# Patient Record
Sex: Female | Born: 1990 | Race: White | Hispanic: No | Marital: Married | Smoking: Never smoker
Health system: Southern US, Community
[De-identification: ages and names within clinical notes are randomized; demographics above are authoritative.]

## PROBLEM LIST (undated history)

## (undated) DIAGNOSIS — J45909 Unspecified asthma, uncomplicated: Secondary | ICD-10-CM

## (undated) HISTORY — PX: APPENDECTOMY: SHX54

## (undated) HISTORY — PX: TONSILLECTOMY: SUR1361

---

## 2009-07-21 ENCOUNTER — Encounter: Payer: Self-pay | Admitting: Family Medicine

## 2009-07-27 ENCOUNTER — Ambulatory Visit: Payer: Self-pay | Admitting: Sports Medicine

## 2009-07-27 DIAGNOSIS — M21969 Unspecified acquired deformity of unspecified lower leg: Secondary | ICD-10-CM | POA: Insufficient documentation

## 2009-07-27 DIAGNOSIS — M549 Dorsalgia, unspecified: Secondary | ICD-10-CM | POA: Insufficient documentation

## 2012-12-09 ENCOUNTER — Encounter: Payer: Self-pay | Admitting: *Deleted

## 2012-12-09 ENCOUNTER — Emergency Department
Admission: EM | Admit: 2012-12-09 | Discharge: 2012-12-09 | Disposition: A | Discharge status: Home / Self Care | Payer: BC Managed Care – PPO | Source: Home / Self Care

## 2012-12-09 DIAGNOSIS — R55 Syncope and collapse: Secondary | ICD-10-CM

## 2012-12-09 HISTORY — DX: Unspecified asthma, uncomplicated: J45.909

## 2012-12-09 LAB — HEMOGLOBIN A1C
Hgb A1c MFr Bld: 5.5 % (ref <5.7)
Mean Plasma Glucose: 111 mg/dL (ref <117)

## 2012-12-09 LAB — POCT CBC W AUTO DIFF (K'VILLE URGENT CARE)

## 2012-12-09 NOTE — ED Notes (Signed)
Gail Sullivan c/o hx of dizziness, tremors and loss of consciousness x 1 episode 1 month ago. Since she has had a couple episodes of the same symptoms without loss of consciousness. Today she developed the dizziness and tremors and came right over to be evaluated. She last ate this AM. Denies HA, blurry vision. C/o mild nausea.

## 2012-12-10 LAB — COMPLETE METABOLIC PANEL WITH GFR
ALT: 20 U/L (ref 0–35)
AST: 24 U/L (ref 0–37)
Albumin: 4.6 g/dL (ref 3.5–5.2)
Alkaline Phosphatase: 53 U/L (ref 39–117)
Potassium: 4.4 mEq/L (ref 3.5–5.3)
Sodium: 138 mEq/L (ref 135–145)
Total Bilirubin: 0.4 mg/dL (ref 0.3–1.2)
Total Protein: 7.5 g/dL (ref 6.0–8.3)

## 2012-12-10 LAB — TSH: TSH: 2.751 u[IU]/mL (ref 0.350–4.500)

## 2012-12-10 NOTE — ED Provider Notes (Signed)
History     CSN: [id number]  Arrival date & time 12/09/12  1301   First MD Initiated Contact with Patient 12/09/12 1407      Chief Complaint  Patient presents with  . Dizziness    HPI  Pt presents today with chief complaint of dizziness/pre syncope.  Pt states that she has had intermittent episodes over the last 1-2 months.  1st episode occurred when she was in the shower her husband, they were having sex per pt.  Pt also also had episodes (1-2) after this that last seconds to minutes. Intermittent in nature, but usually happened after prolonged episodes of standing per pt  Pt reports having mild generalized, weakness, dizziness with episodes.  Pt unsure of LOC, though reports being aroused/awoken by husband with 1st episode. No LOC after 1st episode.  Pt states that she felt a mild episode coming on at work today and wanted to be evaluated. Pt states that she feels extremely anxious when she feels these episodes come on and feels that she cannot control symptoms.  Pt does report being under high amounts of stress.  No cardiac history.  Pt reports an episode of hypoglycemia in the past that was similar to these episodes.  No CP with episodes.  Mild SOB and palpitations.  Baseline hx/o asthma. Has had no wheezing. No albuterol use.  Had IUD put in 2-3 weeks ago. Does not believe that she is pregnant.    Past Medical History  Diagnosis Date  . Asthma     Past Surgical History  Procedure Laterality Date  . Tonsillectomy    . Appendectomy      Family History  Problem Relation Age of Onset  . Thyroid disease Mother   . Hypertension Mother   . Hypertension Father   . Cancer Father     throat    History  Substance Use Topics  . Smoking status: Never Smoker   . Smokeless tobacco: Never Used  . Alcohol Use: Yes    OB History   Grav Para Term Preterm Abortions TAB SAB Ect Mult Living                  Review of Systems  All other systems reviewed and are  negative.    Allergies  Amoxicillin; Bee venom; and Shellfish allergy  Home Medications   Current Outpatient Rx  Name  Route  Sig  Dispense  Refill  . albuterol (PROVENTIL) (2.5 MG/3ML) 0.083% nebulizer solution   Nebulization   Take 2.5 mg by nebulization every 6 (six) hours as needed for wheezing.         Marland Kitchen levonorgestrel (MIRENA) 20 MCG/24HR IUD   Intrauterine   1 each by Intrauterine route once.           BP 128/79  Pulse 85  Ht 5\' 5"  (1.651 m)  Wt 152 lb (68.947 kg)  BMI 25.29 kg/m2  SpO2 100%  Physical Exam  Constitutional: She is oriented to person, place, and time. She appears well-developed and well-nourished.  HENT:  Head: Normocephalic and atraumatic.  Right Ear: External ear normal.  Left Ear: External ear normal.  Mouth/Throat: Oropharynx is clear and moist.  Eyes: Conjunctivae are normal. Pupils are equal, round, and reactive to light.  Neck: Normal range of motion. Neck supple.  Cardiovascular: Normal rate, regular rhythm, normal heart sounds and intact distal pulses.   No murmur heard. Pulmonary/Chest: Effort normal. She has no wheezes. She has no rales.  Abdominal: Soft.  Bowel sounds are normal. There is no tenderness.  Neurological: She is alert and oriented to person, place, and time. No cranial nerve deficit. Coordination normal.  Skin: Skin is warm.  Psychiatric: She has a normal mood and affect.    ED Course  Procedures (including critical care time) EKG: NSR Labs Reviewed  TSH   Narrative:    Performed at:  [facility]                [address]                [place], [place] [address]  HEMOGLOBIN A1C   Narrative:    Performed at:  [facility]                [address]                [place], [place] [address]  COMPLETE METABOLIC PANEL WITH GFR   Narrative:    Performed at:  [facility]                [address]                [place], [place] [address]  POCT  CBC W AUTO DIFF ([facility])  POCT FASTING CBG K[facility] MANUAL ENTRY   No results found.   1. Syncope   2. Pre-syncope   3. Vasovagal near syncope       MDM  EKG, CBC, CBG all WNL today.  Orthostatics normal. Asymptomatic.  Upreg negative.  Ddx includes vasovagal episodes, anxiety Discussed neuroimaging (head CT). Pt declined.  Will also check baseline labs including TSH, A1C, and CMET.  Discussed primary care follow up for further evaluation as well as neurocardio red flags.  Follow up as needed.       The patient and/or caregiver has been counseled thoroughly with regard to treatment plan and/or medications prescribed including dosage, schedule, interactions, rationale for use, and possible side effects and they verbalize understanding. Diagnoses and expected course of recovery discussed and will return if not improved as expected or if the condition worsens. Patient and/or caregiver verbalized understanding.               Doree Albee, MD 12/10/12 1154

## 2012-12-14 ENCOUNTER — Telehealth: Payer: Self-pay | Admitting: Emergency Medicine

## 2012-12-15 ENCOUNTER — Telehealth: Payer: Self-pay | Admitting: Emergency Medicine

## 2013-03-11 ENCOUNTER — Ambulatory Visit (INDEPENDENT_AMBULATORY_CARE_PROVIDER_SITE_OTHER): Payer: BC Managed Care – PPO | Admitting: Physician Assistant

## 2013-03-11 ENCOUNTER — Encounter: Payer: Self-pay | Admitting: Physician Assistant

## 2013-03-11 VITALS — BP 127/85 | HR 88 | Temp 98.3°F | Ht 64.5 in | Wt 159.0 lb

## 2013-03-11 DIAGNOSIS — M797 Fibromyalgia: Secondary | ICD-10-CM

## 2013-03-11 DIAGNOSIS — K1370 Unspecified lesions of oral mucosa: Secondary | ICD-10-CM

## 2013-03-11 DIAGNOSIS — J029 Acute pharyngitis, unspecified: Secondary | ICD-10-CM

## 2013-03-11 DIAGNOSIS — J45990 Exercise induced bronchospasm: Secondary | ICD-10-CM

## 2013-03-11 DIAGNOSIS — K1379 Other lesions of oral mucosa: Secondary | ICD-10-CM

## 2013-03-11 DIAGNOSIS — IMO0001 Reserved for inherently not codable concepts without codable children: Secondary | ICD-10-CM

## 2013-03-11 DIAGNOSIS — J019 Acute sinusitis, unspecified: Secondary | ICD-10-CM

## 2013-03-11 DIAGNOSIS — K121 Other forms of stomatitis: Secondary | ICD-10-CM

## 2013-03-11 MED ORDER — AZITHROMYCIN 250 MG PO TABS: ORAL_TABLET | ORAL | Status: DC

## 2013-03-11 MED ORDER — FLUTICASONE PROPIONATE 50 MCG/ACT NA SUSP: 2.0000 | Freq: Every day | NASAL | Status: DC

## 2013-03-11 NOTE — Patient Instructions (Addendum)
Oral Ulcers °Oral ulcers are painful, shallow sores around the lining of the mouth. They can affect the gums, the inside of the lips and the cheeks (sores on the outside of the lips and on the face are different). They typically first occur in school aged children and teenagers. Oral ulcers may also be called canker sores or cold sores. °CAUSES  °Canker sores and cold sores can be caused by many factors including: °· Infection. °· Injury. °· Sun exposure. °· Medications. °· Emotional stress. °· Food allergies. °· Vitamin deficiencies. °· Toothpastes containing sodium lauryl sulfate. °The Herpes Virus can be the cause of mouth ulcers. The first infection can be severe and cause 10 or more ulcers on the gums, tongue and lips with fever and difficulty in swallowing. This infection usually occurs between the ages of 1 and 3 years.  °SYMPTOMS  °The typical sore is about ¼ inch (6 mm) in size, is an oval or round ulcer with red borders. °DIAGNOSIS  °Your caregiver can diagnose simple oral ulcers by examination. Additional testing is usually not required.  °TREATMENT  °Treatment is aimed at pain relief. Generally, oral ulcers resolve by themselves within 1 to 2 weeks without medication and are not contagious unless caused by Herpes (and other viruses). Antibiotics are not effective with mouth sores. Avoid direct contact with others until the ulcer is completely healed. See your caregiver for follow-up care as recommended. Also: °· Offer a soft diet. °· Encourage plenty of fluids to prevent dehydration. Popsicles and milk shakes can be helpful. °· Avoid acidic and salty foods and drinks such as orange juice. °· Infants and young children will often refuse to drink because of pain. Using a teaspoon, cup or syringe to give small amounts of fluids frequently can help prevent dehydration. °· Cold compresses on the face may help reduce pain. °· Pain medication can help control soreness. °· A solution of diphenhydramine mixed  with a liquid antacid can be useful to decrease the soreness of ulcers. Consult a caregiver for the dosing. °· Liquids or ointments with a numbing ingredient may be helpful when used as recommended. °· Older children and teenagers can rinse their mouth with a salt-water mixture (1/2 teaspoonof salt in 8 ounces of water) four times a day. This treatment is uncomfortable but may reduce the time the ulcers are present. °· There are many over the counter throat lozenges and medications available for oral ulcers. There effectiveness has not been studied. °· Consult your medical caregiver prior to using homeopathic treatments for oral ulcers. °SEEK MEDICAL CARE IF:  °· You think your child needs to be seen. °· The pain worsens and you cannot control it. °· There are 4 or more ulcers. °· The lips and gums begin to bleed and crust. °· A single mouth ulcer is near a tooth that is causing a toothache or pain. °· Your child has a fever, swollen face, or swollen glands. °· The ulcers began after starting a medication. °· Mouth ulcers keep re-occurring or last more than 2 weeks. °· You think your child is not taking adequate fluids. °SEEK IMMEDIATE MEDICAL CARE IF:  °· Your child has a high fever. °· Your child is unable to swallow or becomes dehydrated. °· Your child looks or acts very ill. °· An ulcer caused by a chemical your child accidentally put in their mouth. °Document Released: 10/11/2004 Document Revised: 11/26/2011 Document Reviewed: 05/26/2009 °[employer]® Patient Information ©2014 [employer]. ° °

## 2013-03-11 NOTE — Progress Notes (Signed)
  Subjective:    Patient ID: Gail Sullivan, female    DOB: 1990/10/03, 22 y.o.   MRN: [id number]  HPI Patient is a 22 yo female who presents to the clinic to establish care. She has had sore throat, sinus pressure, left ear pain, headaches for a little over a week. They have continue to get worse despite OTC decongestants and mucinex. She has had a dry cough and feels like her chest has started to get tight. Denies any fever, chills, n/v/d. She does have hx of exercise induced asthma but rarely needs albuterol inhaler.   She has history of recurrent canker sores and wants to know why. Mouthwash dentist gave her does help.   PMH of fibromyalgia per patient she has been put on numerous medications but diet and exercise have worked the best. She occasionally takes neurontin for nerve pain and does help.   She denies any smoking and only social drinking.   Family hx is positive for thyroid issues, HTN, and Hyperlipidemia.      Review of Systems  Constitutional: Positive for fatigue.  Cardiovascular: Negative.   Genitourinary: Negative.   Musculoskeletal: Positive for myalgias, back pain and arthralgias.  Skin: Positive for pallor.       Objective:   Physical Exam  Constitutional: She appears well-developed and well-nourished.  HENT:  Head: Normocephalic and atraumatic.  Right Ear: External ear normal.  Left Ear: External ear normal.  TM clear.   Tonsils removed. Erythematous oropharynx no exudate.  Maxillary sinus tenderness to palpation bilaterally.   Eyes: Conjunctivae are normal. Right eye exhibits no discharge. Left eye exhibits no discharge.  Neck: Normal range of motion. Neck supple.  Cardiovascular: Normal rate, regular rhythm and normal heart sounds.   Pulmonary/Chest: Effort normal and breath sounds normal. She has no wheezes.  Lymphadenopathy:    She has no cervical adenopathy.  Skin: Skin is warm and dry.  Psychiatric: She has a normal mood and affect. Her  behavior is normal.          Assessment & Plan:  Sore throat/sinusitis- Rapid strep negative. Symptoms consistent with sinus infection have been going on for over a week. Allergic to Amoxicillin will give zpak. Also gave nasal spray to use daily. Refilled albuterol inhaler to also use as needed for SOB/wheezing. Follow up if not improving.   Oral ulcers- Discussed potential cause. Gave HO on ways to try to prevent and help symptoms. Continue to use mouthwash dentist gave to use as needed.   fibromyagia- I would like to see her records and see what they have tested and recommend. Right now she takes Neurontin as needed. Will discuss at next visit.    Follow up in 1-2 months to talk about ongoing healthy issues and concerns.

## 2013-03-12 DIAGNOSIS — J45990 Exercise induced bronchospasm: Secondary | ICD-10-CM | POA: Insufficient documentation

## 2013-03-12 DIAGNOSIS — M797 Fibromyalgia: Secondary | ICD-10-CM | POA: Insufficient documentation

## 2013-03-12 DIAGNOSIS — K121 Other forms of stomatitis: Secondary | ICD-10-CM | POA: Insufficient documentation

## 2013-03-23 ENCOUNTER — Ambulatory Visit: Payer: BC Managed Care – PPO | Admitting: Physician Assistant

## 2013-05-19 ENCOUNTER — Other Ambulatory Visit: Payer: Self-pay | Admitting: *Deleted

## 2013-05-19 MED ORDER — TRIAMCINOLONE ACETONIDE 0.5 % EX OINT: TOPICAL_OINTMENT | Freq: Two times a day (BID) | CUTANEOUS | Status: DC

## 2013-06-15 ENCOUNTER — Ambulatory Visit (INDEPENDENT_AMBULATORY_CARE_PROVIDER_SITE_OTHER): Payer: 59 | Admitting: Physician Assistant

## 2013-06-15 VITALS — BP 127/78 | HR 74 | Temp 98.3°F | Wt 161.0 lb

## 2013-06-15 DIAGNOSIS — J029 Acute pharyngitis, unspecified: Secondary | ICD-10-CM

## 2013-06-15 DIAGNOSIS — R635 Abnormal weight gain: Secondary | ICD-10-CM

## 2013-06-15 DIAGNOSIS — J45990 Exercise induced bronchospasm: Secondary | ICD-10-CM

## 2013-06-15 DIAGNOSIS — L738 Other specified follicular disorders: Secondary | ICD-10-CM

## 2013-06-15 DIAGNOSIS — J302 Other seasonal allergic rhinitis: Secondary | ICD-10-CM

## 2013-06-15 DIAGNOSIS — R5381 Other malaise: Secondary | ICD-10-CM

## 2013-06-15 DIAGNOSIS — J329 Chronic sinusitis, unspecified: Secondary | ICD-10-CM

## 2013-06-15 DIAGNOSIS — L853 Xerosis cutis: Secondary | ICD-10-CM

## 2013-06-15 DIAGNOSIS — R5383 Other fatigue: Secondary | ICD-10-CM

## 2013-06-15 DIAGNOSIS — J309 Allergic rhinitis, unspecified: Secondary | ICD-10-CM

## 2013-06-15 MED ORDER — AZITHROMYCIN 250 MG PO TABS: ORAL_TABLET | ORAL | Status: DC

## 2013-06-15 NOTE — Progress Notes (Signed)
  Subjective:    Patient ID: Gail Sullivan, female    DOB: 1991/08/24, 22 y.o.   MRN: [id number]  HPI Patient is a 22 yo female who presents to the clinic with 1 week of sinus pressure and sore throat. She thought it was just sinus drainage but has progressively gotten worse. She now is having problems swallowing. She continues to eat. Denies any fever. She has had some cough but not much production. Denies any abdominal pain. She has tried OTC mucinex, vitamin C, cough syrup, ibuprofen. theny have helped some but still has symptoms.   Pt also concerned because the last 6 months she has had lots of skin and hair changes. Her skin has went from oily to dry. She has a lot of hair loss. She continues to gain weight despite exercise and diet changes. She feels like her memory is bad. She would like her Thyroid checked.    Pt also has a hx of exercised induced asthma and allergies she would like to be seen by allergist to do more testing.   Review of Systems     Objective:   Physical Exam  Constitutional: She is oriented to person, place, and time. She appears well-developed and well-nourished.  HENT:  Head: Normocephalic and atraumatic.  Right Ear: External ear normal.  Left Ear: External ear normal.  TM's clear bulging with air bubbles.  Oropharyx erythematous with enlarged tonsils without exudate. Maxillary sinuses tender to palpation bilaterally.  Bilateral turbinates red and swollen.   Eyes: Conjunctivae are normal. Right eye exhibits no discharge. Left eye exhibits no discharge.  Neck: Normal range of motion. Neck supple.  Bilateral anterior cervical lymph nodes enlarged and tender to palpation.   Cardiovascular: Normal rate, regular rhythm and normal heart sounds.   Pulmonary/Chest: Effort normal and breath sounds normal. She has no wheezes.  Lymphadenopathy:    She has cervical adenopathy.  Neurological: She is alert and oriented to person, place, and time.  Skin: Skin is warm  and dry.  Psychiatric: She has a normal mood and affect. Her behavior is normal.          Assessment & Plan:  Sinusitis/acute pharyngitis- Rapid strep negative. Treated with zpak for sinusitis. Discussed symptomatic ideas. Call if not improving or suddenly worsening.  Dry skin/weight gain/hair loss- will check TSH/Free T4 today. Will call with results. If normal discussed coming in for another appt to reevaluate.    Exercise induced asthma/allergies- Will make referral at pt's request.

## 2013-06-15 NOTE — Patient Instructions (Addendum)

## 2013-06-16 LAB — T4, FREE: Free T4: 1.05 ng/dL (ref 0.80–1.80)

## 2013-07-03 ENCOUNTER — Encounter: Payer: Self-pay | Admitting: Physician Assistant

## 2013-07-03 ENCOUNTER — Ambulatory Visit (INDEPENDENT_AMBULATORY_CARE_PROVIDER_SITE_OTHER): Payer: 59 | Admitting: Physician Assistant

## 2013-07-03 ENCOUNTER — Ambulatory Visit (INDEPENDENT_AMBULATORY_CARE_PROVIDER_SITE_OTHER): Payer: 59

## 2013-07-03 VITALS — BP 121/69 | HR 80 | Wt 161.0 lb

## 2013-07-03 DIAGNOSIS — M79609 Pain in unspecified limb: Secondary | ICD-10-CM

## 2013-07-03 DIAGNOSIS — M79671 Pain in right foot: Secondary | ICD-10-CM

## 2013-07-03 MED ORDER — TRAMADOL HCL 50 MG PO TABS: 50.0000 mg | ORAL_TABLET | Freq: Four times a day (QID) | ORAL | Status: DC | PRN

## 2013-07-03 NOTE — Progress Notes (Signed)
  Subjective:    Patient ID: Gail Sullivan, female    DOB: 1991/05/13, 22 y.o.   MRN: [id number]  HPI Patient is a 22 year old female who presents to the clinic with right heel pain for the last month. In the last 3 months she started training for a half marathon. She's been running a lot. She reports she does have good supporting shoes. She however works in shoes that are not as supportive and she's on her feet all day. When she started noticing the pain about a month ago it was just a discomfort. It has continually gotten worse to where when she walks the pain is significant around a 6/10. It is actually better when she is running because of how much support in her shoes. She does notice that she is trying to walk on the lateral aspect of her foot to shield to heal from touching the ground. She has not done anything to help symptoms. There is no pain at rest only pain with palpation or pressure. She has decreased her running back to about a mile or 2 a day.   Review of Systems     Objective:   Physical Exam  Musculoskeletal:  Right foot: Strength 5 out of 5. No pain with palpation over the Achilles tendon. Range of motion of the normal without pain. No pain over the lateral or medial malleolus or metatarsal bones. No pain over the fascia. There is pain on the medial aspect of the right heel. Feels like there is a hard knot almost like a callus. It is not fluctuant or soft. There is some redness and pain with palpation.          Assessment & Plan:  Right heel pain-I will get an x-ray today. X-rays were reviewed and negative for any heel spurs or soft tissue changes. Etiology unclear. Does not appear to be a cyst. Could be a start of plantar wart. Ibuprofen 800mg  up to three times a day. Tramadol as needed. Ice multiple times a day and rest.  Stay in supportive shoes. Follow up if not improving in the next week.

## 2013-07-03 NOTE — Patient Instructions (Addendum)
Ibuprofen 800mg  up to three times a day. Tramadol as needed. Ice multiple times a day and rest.  Will call with xray. Stay in supportive shoes.

## 2013-07-10 ENCOUNTER — Encounter: Payer: Self-pay | Admitting: Physician Assistant

## 2013-07-10 ENCOUNTER — Ambulatory Visit (INDEPENDENT_AMBULATORY_CARE_PROVIDER_SITE_OTHER): Payer: 59 | Admitting: Physician Assistant

## 2013-07-10 VITALS — BP 122/74 | HR 62 | Wt 161.0 lb

## 2013-07-10 DIAGNOSIS — L708 Other acne: Secondary | ICD-10-CM

## 2013-07-10 DIAGNOSIS — L709 Acne, unspecified: Secondary | ICD-10-CM

## 2013-07-10 DIAGNOSIS — M722 Plantar fascial fibromatosis: Secondary | ICD-10-CM

## 2013-07-10 DIAGNOSIS — M79671 Pain in right foot: Secondary | ICD-10-CM

## 2013-07-10 MED ORDER — TRETINOIN 0.1 % EX CREA: TOPICAL_CREAM | Freq: Every day | CUTANEOUS | Status: DC

## 2013-07-10 NOTE — Patient Instructions (Signed)
Consider biotin for skin. Stay hydrated.    Plantar Fasciitis Plantar fasciitis is a common condition that causes foot pain. It is soreness (inflammation) of the band of tough fibrous tissue on the bottom of the foot that runs from the heel bone (calcaneus) to the ball of the foot. The cause of this soreness may be from excessive standing, poor fitting shoes, running on hard surfaces, being overweight, having an abnormal walk, or overuse (this is common in runners) of the painful foot or feet. It is also common in aerobic exercise dancers and ballet dancers. SYMPTOMS  Most people with plantar fasciitis complain of:  Severe pain in the morning on the bottom of their foot especially when taking the first steps out of bed. This pain recedes after a few minutes of walking.  Severe pain is experienced also during walking following a long period of inactivity.  Pain is worse when walking barefoot or up stairs DIAGNOSIS   Your caregiver will diagnose this condition by examining and feeling your foot.  Special tests such as X-rays of your foot, are usually not needed. PREVENTION   Consult a sports medicine professional before beginning a new exercise program.  Walking programs offer a good workout. With walking there is a lower chance of overuse injuries common to runners. There is less impact and less jarring of the joints.  Begin all new exercise programs slowly. If problems or pain develop, decrease the amount of time or distance until you are at a comfortable level.  Wear good shoes and replace them regularly.  Stretch your foot and the heel cords at the back of the ankle (Achilles tendon) both before and after exercise.  Run or exercise on even surfaces that are not hard. For example, asphalt is better than pavement.  Do not run barefoot on hard surfaces.  If using a treadmill, vary the incline.  Do not continue to workout if you have foot or joint problems. Seek professional help if  they do not improve. HOME CARE INSTRUCTIONS   Avoid activities that cause you pain until you recover.  Use ice or cold packs on the problem or painful areas after working out.  Only take over-the-counter or prescription medicines for pain, discomfort, or fever as directed by your caregiver.  Soft shoe inserts or athletic shoes with air or gel sole cushions may be helpful.  If problems continue or become more severe, consult a sports medicine caregiver or your own health care provider. Cortisone is a potent anti-inflammatory medication that may be injected into the painful area. You can discuss this treatment with your caregiver. MAKE SURE YOU:   Understand these instructions.  Will watch your condition.  Will get help right away if you are not doing well or get worse. Document Released: 05/29/2001 Document Revised: 11/26/2011 Document Reviewed: 07/28/2008 [facility] Patient Information 2014 [place], [place].

## 2013-07-12 NOTE — Progress Notes (Signed)
  Subjective:    Patient ID: Gail Sullivan, female    DOB: [birth year], 22 y.o.   MRN: [id number]  HPI Pt presents to the clinic to follow up on right heel pain and nodule and to discuss acne.  Right heel nodule does seem to be improving but still painful. Wearing tennis shoes most of the time and does help. Has stopped training for marathon. Ibuprofen does help pain when she takes it.   Acne has been present since starting mirena. Acne present on face, chest, bilateral arms, and back. She also has much worse dry skin. She did not tolerate oral OCP great at all. She would like to see if we could treat acne so she can keep mirena.    Review of Systems     Objective:   Physical Exam  Constitutional: She appears well-developed and well-nourished.  Musculoskeletal:  Discomfort with palpation over right fascia but more intense pain over right medial heel. Where the pain is a small harden nodule is palpated that is tender. Pain worse with plantar flexion.   Skin:  Erythematous papules on bilateral arms, chest, and face. Negative for pustules/blackheads/cysts.          Assessment & Plan:  Right heel pain- offered injection today. Pt agreed. Will see if that helps with pain. Continue plantar fasicittis exercises, ibuprofen, rest, and good supportive shoes.  If still not improving pt needs to see my partner Dr. Bonnita Levan.   Heel Injection Procedure Note  Pre-operative Diagnosis: right plantar fasciitis/nodule of right medial heel  Post-operative Diagnosis: same  Indications: pain  Anesthesia: Lidocaine 1% without epinephrine without added sodium bicarbonate  Procedure Details   Verbal consent was obtained for the procedure. The point of maximum tenderness was identified and marked.  After a sterile Betadine prep, the site was anesthetized.  A 27 gauge needle was advanced into the area without difficulty.  The area was then injected with 1 ml of triamcinolone (KENALOG) 40mg /ml .  The area was cleansed with isopropyl alcohol and a dressing was applied.   Complications:  None; patient tolerated the procedure well.   Acne- Retin-A sent to pharmacy to try once a day. Remember to use good hypoallergenic moisterizer such as cetaphil/aveeno. Will continue to follow.

## 2013-07-15 ENCOUNTER — Encounter: Payer: Self-pay | Admitting: Physician Assistant

## 2013-07-16 ENCOUNTER — Other Ambulatory Visit: Payer: Self-pay | Admitting: Physician Assistant

## 2013-07-16 DIAGNOSIS — M722 Plantar fascial fibromatosis: Secondary | ICD-10-CM

## 2013-07-16 DIAGNOSIS — M79671 Pain in right foot: Secondary | ICD-10-CM

## 2013-07-16 MED ORDER — TRAMADOL HCL 50 MG PO TABS: 50.0000 mg | ORAL_TABLET | Freq: Four times a day (QID) | ORAL | Status: DC | PRN

## 2013-07-16 MED ORDER — MELOXICAM 15 MG PO TABS: 15.0000 mg | ORAL_TABLET | Freq: Every day | ORAL | Status: DC

## 2013-07-23 ENCOUNTER — Other Ambulatory Visit: Payer: Self-pay

## 2013-08-09 ENCOUNTER — Encounter: Payer: Self-pay | Admitting: Emergency Medicine

## 2013-08-09 ENCOUNTER — Emergency Department (INDEPENDENT_AMBULATORY_CARE_PROVIDER_SITE_OTHER)
Admission: EM | Admit: 2013-08-09 | Discharge: 2013-08-09 | Disposition: A | Discharge status: Home / Self Care | Payer: 59 | Source: Home / Self Care | Attending: Family Medicine | Admitting: Family Medicine

## 2013-08-09 DIAGNOSIS — M7520 Bicipital tendinitis, unspecified shoulder: Secondary | ICD-10-CM

## 2013-08-09 DIAGNOSIS — M7521 Bicipital tendinitis, right shoulder: Secondary | ICD-10-CM

## 2013-08-09 MED ORDER — MELOXICAM 15 MG PO TABS: 15.0000 mg | ORAL_TABLET | Freq: Every day | ORAL | Status: DC

## 2013-08-09 MED ORDER — HYDROCODONE-ACETAMINOPHEN 5-325 MG PO TABS: ORAL_TABLET | ORAL | Status: DC

## 2013-08-09 NOTE — ED Provider Notes (Signed)
CSN: [id number]     Arrival date & time 08/09/13  1646 History   First MD Initiated Contact with Patient 08/09/13 1704     Chief Complaint  Patient presents with  . Shoulder Pain    x 2 days      HPI Comments: Patient was throwing bean bags overhead with her right hand two days ago.  She felt some burning pain in her right shoulder but not disabling.  Over the past two days she has developed gradually increasing pain in her right shoulder.  She now has difficulty sleeping at night.  Patient is a 22 y.o. female presenting with shoulder injury. The history is provided by the patient.  Shoulder Injury This is a new problem. The current episode started 2 days ago. The problem occurs constantly. The problem has been gradually worsening. Associated symptoms comments: none. Exacerbated by: abducting right shoulder. Nothing relieves the symptoms. Treatments tried: albuterol and ice pack. The treatment provided mild relief.    Past Medical History  Diagnosis Date  . Asthma    Past Surgical History  Procedure Laterality Date  . Tonsillectomy    . Appendectomy     Family History  Problem Relation Age of Onset  . Thyroid disease Mother   . Hypertension Mother   . Hypertension Father   . Cancer Father     throat  . Cancer Paternal Uncle   . Cancer Maternal Grandfather   . Stroke Maternal Grandfather   . Cancer Paternal Grandmother    History  Substance Use Topics  . Smoking status: Never Smoker   . Smokeless tobacco: Never Used  . Alcohol Use: Yes   OB History   Grav Para Term Preterm Abortions TAB SAB Ect Mult Living                 Review of Systems  All other systems reviewed and are negative.    Allergies  Amoxicillin; Bee venom; and Shellfish allergy  Home Medications   Current Outpatient Rx  Name  Route  Sig  Dispense  Refill  . albuterol (PROVENTIL HFA;VENTOLIN HFA) 108 (90 BASE) MCG/ACT inhaler   Inhalation   Inhale 2 puffs into the lungs every 6 (six) hours  as needed for wheezing.         . gabapentin (NEURONTIN) 100 MG capsule   Oral   Take 100 mg by mouth as needed.         Marland Kitchen HYDROcodone-acetaminophen (NORCO/VICODIN) 5-325 MG per tablet      Take one by mouth at bedtime as needed for pain   10 tablet   0   . levonorgestrel (MIRENA) 20 MCG/24HR IUD   Intrauterine   1 each by Intrauterine route once.         . meloxicam (MOBIC) 15 MG tablet   Oral   Take 1 tablet (15 mg total) by mouth daily.   15 tablet   1   . traMADol (ULTRAM) 50 MG tablet   Oral   Take 1 tablet (50 mg total) by mouth every 6 (six) hours as needed for pain.   30 tablet   0   . traMADol (ULTRAM) 50 MG tablet   Oral   Take 1 tablet (50 mg total) by mouth every 6 (six) hours as needed for pain.   30 tablet   0   . tretinoin (RETIN-A) 0.1 % cream   Topical   Apply topically at bedtime.   45 g   0  BP 127/89  Pulse 81  Temp(Src) 98.1 F (36.7 C) (Oral)  Ht 5' 5.5" (1.664 m)  Wt 166 lb 12.8 oz (75.66 kg)  BMI 27.32 kg/m2  SpO2 99% Physical Exam  Nursing note and vitals reviewed. Constitutional: She is oriented to person, place, and time. She appears well-developed and well-nourished. No distress.  HENT:  Head: Atraumatic.  Eyes: Conjunctivae are normal. Pupils are equal, round, and reactive to light.  Neck: Normal range of motion.  Musculoskeletal:       Right shoulder: She exhibits tenderness and pain. She exhibits normal range of motion, no bony tenderness, no swelling, no effusion, no crepitus, no deformity, no spasm, normal pulse and normal strength.       Arms: Right shoulder has full active range of motion with mild hesitation.  Patient is able to do Apley's test with some hesitation.  Negative empty can test.  Good strength with internal/external rotation.  Hawkins test slightly positive.   There is distinct tenderness over the insertion of the right biceps tendons, especially with resisted flexion of the biceps. Distal  neurovascular function is intact.   Neurological: She is alert and oriented to person, place, and time.  Skin: Skin is warm and dry.    ED Course  Procedures         MDM   1. Biceps tendonitis, right;  Could also have mild component of rotator cuff tendonitis.    Dispensed sling Begin Mobic. Take Ibuprofen 200mg , 4 tabs this evening and start Mobic tomorrow morning.  Apply ice pack several times daily.  Begin shoulder range of motion exercises ([facility] information and instruction handout given)  Followup with [facility] if not improving about two weeks.     Lattie Haw, MD 08/10/13 [phone]

## 2013-08-09 NOTE — ED Notes (Signed)
Gail Sullivan complains of a burning shoulder pain for 2 days. She was playing corn hole and throwing over handed the day before. The pain is a 8/10 on the pain scale.

## 2013-08-11 ENCOUNTER — Institutional Professional Consult (permissible substitution): Payer: 59 | Admitting: Sports Medicine

## 2013-12-03 ENCOUNTER — Encounter: Payer: Self-pay | Admitting: Physician Assistant

## 2014-01-01 ENCOUNTER — Telehealth: Payer: Self-pay | Admitting: *Deleted

## 2014-01-01 NOTE — Telephone Encounter (Signed)
Left detailed message asking pt to call back & make an appt.

## 2014-01-01 NOTE — Telephone Encounter (Signed)
Pt called & stated that she is feeling really exhausted all the time & was wondering if she could have her thyroid & possibly her iron checked.  Would you like for her to come in to see you first? Or just labs? Please advise.

## 2014-01-01 NOTE — Telephone Encounter (Signed)
It has been awhile since seen. i would like to talk to her first and then could get labs.

## 2014-01-18 ENCOUNTER — Ambulatory Visit (INDEPENDENT_AMBULATORY_CARE_PROVIDER_SITE_OTHER): Payer: 59 | Admitting: Physician Assistant

## 2014-01-18 ENCOUNTER — Encounter: Payer: Self-pay | Admitting: Physician Assistant

## 2014-01-18 VITALS — BP 126/71 | HR 75 | Wt 166.0 lb

## 2014-01-18 DIAGNOSIS — F32A Depression, unspecified: Secondary | ICD-10-CM

## 2014-01-18 DIAGNOSIS — M797 Fibromyalgia: Secondary | ICD-10-CM

## 2014-01-18 DIAGNOSIS — IMO0001 Reserved for inherently not codable concepts without codable children: Secondary | ICD-10-CM

## 2014-01-18 DIAGNOSIS — F3289 Other specified depressive episodes: Secondary | ICD-10-CM

## 2014-01-18 DIAGNOSIS — F329 Major depressive disorder, single episode, unspecified: Secondary | ICD-10-CM

## 2014-01-18 DIAGNOSIS — R5381 Other malaise: Secondary | ICD-10-CM

## 2014-01-18 DIAGNOSIS — R5383 Other fatigue: Secondary | ICD-10-CM

## 2014-01-18 DIAGNOSIS — F411 Generalized anxiety disorder: Secondary | ICD-10-CM

## 2014-01-18 MED ORDER — CYCLOBENZAPRINE HCL 10 MG PO TABS: 10.0000 mg | ORAL_TABLET | Freq: Every day | ORAL | Status: DC

## 2014-01-18 MED ORDER — DULOXETINE HCL 30 MG PO CPEP: ORAL_CAPSULE | ORAL | Status: DC

## 2014-01-18 NOTE — Patient Instructions (Signed)
Irritable Bowel Syndrome °Irritable Bowel Syndrome (IBS) is caused by a disturbance of normal bowel function. Other terms used are spastic colon, mucous colitis, and irritable colon. It does not require surgery, nor does it lead to cancer. There is no cure for IBS. But with proper diet, stress reduction, and medication, you will find that your problems (symptoms) will gradually disappear or improve. IBS is a common digestive disorder. It usually appears in late adolescence or early adulthood. Women develop it twice as often as men. °CAUSES  °After food has been digested and absorbed in the small intestine, waste material is moved into the colon (large intestine). In the colon, water and salts are absorbed from the undigested products coming from the small intestine. The remaining residue, or fecal material, is held for elimination. Under normal circumstances, gentle, rhythmic contractions on the bowel walls push the fecal material along the colon towards the rectum. In IBS, however, these contractions are irregular and poorly coordinated. The fecal material is either retained too long, resulting in constipation, or expelled too soon, producing diarrhea. °SYMPTOMS  °The most common symptom of IBS is pain. It is typically in the lower left side of the belly (abdomen). But it may occur anywhere in the abdomen. It can be felt as heartburn, backache, or even as a dull pain in the arms or shoulders. The pain comes from excessive bowel-muscle spasms and from the buildup of gas and fecal material in the colon. This pain: °· Can range from sharp belly (abdominal) cramps to a dull, continuous ache. °· Usually worsens soon after eating. °· Is typically relieved by having a bowel movement or passing gas. °Abdominal pain is usually accompanied by constipation. But it may also produce diarrhea. The diarrhea typically occurs right after a meal or upon arising in the morning. The stools are typically soft and watery. They are often  flecked with secretions (mucus). °Other symptoms of IBS include: °· Bloating. °· Loss of appetite. °· Heartburn. °· Feeling sick to your stomach (nausea). °· Belching °· Vomiting °· Gas. °IBS may also cause a number of symptoms that are unrelated to the digestive system: °· Fatigue. °· Headaches. °· Anxiety °· Shortness of breath °· Difficulty in concentrating. °· Dizziness. °These symptoms tend to come and go. °DIAGNOSIS  °The symptoms of IBS closely mimic the symptoms of other, more serious digestive disorders. So your caregiver may wish to perform a variety of additional tests to exclude these disorders. He/she wants to be certain of learning what is wrong (diagnosis). The nature and purpose of each test will be explained to you. °TREATMENT °A number of medications are available to help correct bowel function and/or relieve bowel spasms and abdominal pain. Among the drugs available are: °· Mild, non-irritating laxatives for severe constipation and to help restore normal bowel habits. °· Specific anti-diarrheal medications to treat severe or prolonged diarrhea. °· Anti-spasmodic agents to relieve intestinal cramps. °· Your caregiver may also decide to treat you with a mild tranquilizer or sedative during unusually stressful periods in your life. °The important thing to remember is that if any drug is prescribed for you, make sure that you take it exactly as directed. Make sure that your caregiver knows how well it worked for you. °HOME CARE INSTRUCTIONS  °· Avoid foods that are high in fat or oils. Some examples are:heavy cream, butter, frankfurters, sausage, and other fatty meats. °· Avoid foods that have a laxative effect, such as fruit, fruit juice, and dairy products. °· Cut out   carbonated drinks, chewing gum, and "gassy" foods, such as beans and cabbage. This may help relieve bloating and belching. °· Bran taken with plenty of liquids may help relieve constipation. °· Keep track of what foods seem to trigger  your symptoms. °· Avoid emotionally charged situations or circumstances that produce anxiety. °· Start or continue exercising. °· Get plenty of rest and sleep. °MAKE SURE YOU:  °· Understand these instructions. °· Will watch your condition. °· Will get help right away if you are not doing well or get worse. °Document Released: 09/03/2005 Document Revised: 11/26/2011 Document Reviewed: 04/23/2008 °[employer]® Patient Information ©2014 [employer]. ° °

## 2014-01-19 LAB — VITAMIN D 25 HYDROXY (VIT D DEFICIENCY, FRACTURES): Vit D, 25-Hydroxy: 38 ng/mL (ref 30–89)

## 2014-01-19 LAB — VITAMIN B12: Vitamin B-12: 342 pg/mL (ref 211–911)

## 2014-01-20 ENCOUNTER — Encounter: Payer: Self-pay | Admitting: Physician Assistant

## 2014-01-20 DIAGNOSIS — F329 Major depressive disorder, single episode, unspecified: Secondary | ICD-10-CM | POA: Insufficient documentation

## 2014-01-20 DIAGNOSIS — R5381 Other malaise: Secondary | ICD-10-CM | POA: Insufficient documentation

## 2014-01-20 DIAGNOSIS — F411 Generalized anxiety disorder: Secondary | ICD-10-CM | POA: Insufficient documentation

## 2014-01-20 DIAGNOSIS — R5383 Other fatigue: Secondary | ICD-10-CM

## 2014-01-20 DIAGNOSIS — F32A Depression, unspecified: Secondary | ICD-10-CM | POA: Insufficient documentation
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
## 2014-01-22 ENCOUNTER — Other Ambulatory Visit: Payer: Self-pay | Admitting: Physician Assistant

## 2014-01-22 MED ORDER — LEVOMILNACIPRAN HCL ER 40 MG PO CP24: ORAL_CAPSULE | ORAL | Status: DC

## 2014-01-22 NOTE — Progress Notes (Signed)
Amber please get samples of fetzima 20mg  daily for two days and then give 40mg  at least enough for a month. Attach prescription along with coupon card.     Patient advised and medication, prescription and savings card has been left up front.

## 2014-01-25 ENCOUNTER — Other Ambulatory Visit: Payer: Self-pay | Admitting: Physician Assistant

## 2014-01-25 MED ORDER — VENLAFAXINE HCL ER 37.5 MG PO CP24: 37.5000 mg | ORAL_CAPSULE | Freq: Every day | ORAL | Status: DC

## 2014-02-19 ENCOUNTER — Encounter: Payer: Self-pay | Admitting: Physician Assistant

## 2014-05-26 ENCOUNTER — Ambulatory Visit (INDEPENDENT_AMBULATORY_CARE_PROVIDER_SITE_OTHER): Payer: 59 | Admitting: Family Medicine

## 2014-05-26 ENCOUNTER — Encounter: Payer: Self-pay | Admitting: Family Medicine

## 2014-05-26 VITALS — BP 141/81 | HR 74 | Wt 165.0 lb

## 2014-05-26 DIAGNOSIS — Z23 Encounter for immunization: Secondary | ICD-10-CM

## 2014-05-26 DIAGNOSIS — B359 Dermatophytosis, unspecified: Secondary | ICD-10-CM

## 2014-05-26 MED ORDER — CLOTRIMAZOLE 1 % EX CREA: TOPICAL_CREAM | CUTANEOUS | Status: DC

## 2014-05-26 NOTE — Progress Notes (Signed)
CC: Gail Sullivan is a 23 y.o. female is here for concerned about ringworm   Subjective: HPI:  Rash on the left proximal arm has been present the last week. She's been scratching at it but she's unsure whether or not it's itchy. It is painless. Has not been getting better or worse since onset. She's never had this before denies any skin changes elsewhere. No interventions as of yet other than covering with a Band-Aid. Denies fevers, chills, swollen lymph nodes nor flushing.    Review Of Systems Outlined In HPI  Past Medical History  Diagnosis Date  . Asthma     Past Surgical History  Procedure Laterality Date  . Tonsillectomy    . Appendectomy     Family History  Problem Relation Age of Onset  . Thyroid disease Mother   . Hypertension Mother   . Hypertension Father   . Cancer Father     throat  . Cancer Paternal Uncle   . Cancer Maternal Grandfather   . Stroke Maternal Grandfather   . Cancer Paternal Grandmother     History   Social History  . Marital Status: Married    Spouse Name: N/A    Number of Children: N/A  . Years of Education: N/A   Occupational History  . Not on file.   Social History Main Topics  . Smoking status: Never Smoker   . Smokeless tobacco: Never Used  . Alcohol Use: Yes  . Drug Use: No  . Sexual Activity: Not on file   Other Topics Concern  . Not on file   Social History Narrative  . No narrative on file     Objective: BP 141/81  Pulse 74  Wt 165 lb (74.844 kg)  General: Alert and Oriented, No Acute Distress HEENT: Pupils equal, round, reactive to light. Conjunctivae clear.  Moist membranes pharynx unremarkable Extremities: No peripheral edema.  Strong peripheral pulses.  Mental Status: No depression, anxiety, nor agitation. Skin: Warm and dry. 1 cm erythematous slightly raised rash at the borders sparing the Center on the left proximal lateral arm overlying the triceps  Assessment & Plan: Gail Sullivan was seen today for  concerned about ringworm.  Diagnoses and associated orders for this visit:  Ringworm - clotrimazole (LOTRIMIN) 1 % cream; Apply to affected areas twice a day for up to four weeks, applying up to two weeks after resolution of symptoms.     Start clotrimazole and avoid skin to skin contact with others to avoid spreading. Keep covered with a Band-Aid  Return if symptoms worsen or fail to improve.

## 2014-06-17 ENCOUNTER — Encounter: Payer: Self-pay | Admitting: Sports Medicine

## 2014-06-17 ENCOUNTER — Ambulatory Visit (INDEPENDENT_AMBULATORY_CARE_PROVIDER_SITE_OTHER): Payer: 59 | Admitting: Sports Medicine

## 2014-06-17 ENCOUNTER — Ambulatory Visit (INDEPENDENT_AMBULATORY_CARE_PROVIDER_SITE_OTHER): Payer: 59

## 2014-06-17 VITALS — BP 142/82 | HR 88 | Ht 64.0 in | Wt 164.0 lb

## 2014-06-17 DIAGNOSIS — S060X0A Concussion without loss of consciousness, initial encounter: Secondary | ICD-10-CM

## 2014-06-17 DIAGNOSIS — S060X9A Concussion with loss of consciousness of unspecified duration, initial encounter: Secondary | ICD-10-CM | POA: Insufficient documentation

## 2014-06-17 DIAGNOSIS — S060XAA Concussion with loss of consciousness status unknown, initial encounter: Secondary | ICD-10-CM | POA: Insufficient documentation

## 2014-06-17 DIAGNOSIS — S134XXA Sprain of ligaments of cervical spine, initial encounter: Secondary | ICD-10-CM

## 2014-06-17 DIAGNOSIS — M542 Cervicalgia: Secondary | ICD-10-CM

## 2014-06-17 MED ORDER — DICLOFENAC SODIUM 75 MG PO TBEC: 75.0000 mg | DELAYED_RELEASE_TABLET | Freq: Two times a day (BID) | ORAL | Status: DC

## 2014-06-17 MED ORDER — PREDNISONE 50 MG PO TABS: ORAL_TABLET | ORAL | Status: DC

## 2014-06-17 MED ORDER — CYCLOBENZAPRINE HCL 10 MG PO TABS: ORAL_TABLET | ORAL | Status: DC
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
## 2014-06-17 NOTE — Assessment & Plan Note (Addendum)
Toradol 30 intramuscular. Prednisone, Voltaren, Flexeril, rehabilitation exercises, x-rays. Return in 2 weeks.

## 2014-06-24 ENCOUNTER — Encounter: Payer: Self-pay | Admitting: Sports Medicine

## 2014-06-24 MED ORDER — PREDNISONE (PAK) 10 MG PO TABS: ORAL_TABLET | ORAL | Status: DC

## 2014-06-24 MED ORDER — TRAMADOL HCL 50 MG PO TABS: ORAL_TABLET | ORAL | Status: DC

## 2014-06-25 ENCOUNTER — Telehealth: Payer: Self-pay

## 2014-06-25 MED ORDER — TRAMADOL HCL 50 MG PO TABS: ORAL_TABLET | ORAL | Status: DC

## 2014-06-25 NOTE — Telephone Encounter (Signed)
Medication request. Estelle Junehonda Cunningham,CMA

## 2014-06-30 ENCOUNTER — Ambulatory Visit (INDEPENDENT_AMBULATORY_CARE_PROVIDER_SITE_OTHER): Payer: 59 | Admitting: Sports Medicine

## 2014-06-30 ENCOUNTER — Encounter: Payer: Self-pay | Admitting: Sports Medicine

## 2014-06-30 VITALS — BP 138/88 | HR 96 | Ht 64.0 in | Wt 162.0 lb

## 2014-06-30 DIAGNOSIS — S060X0A Concussion without loss of consciousness, initial encounter: Secondary | ICD-10-CM

## 2014-06-30 DIAGNOSIS — S134XXD Sprain of ligaments of cervical spine, subsequent encounter: Secondary | ICD-10-CM
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
## 2014-06-30 NOTE — Assessment & Plan Note (Signed)
Nearly resolved

## 2014-06-30 NOTE — Assessment & Plan Note (Signed)
Resolved

## 2014-08-17 ENCOUNTER — Ambulatory Visit: Payer: 59 | Admitting: Sports Medicine

## 2014-08-19 ENCOUNTER — Ambulatory Visit (INDEPENDENT_AMBULATORY_CARE_PROVIDER_SITE_OTHER): Payer: 59 | Admitting: Sports Medicine

## 2014-08-19 ENCOUNTER — Encounter: Payer: Self-pay | Admitting: Sports Medicine

## 2014-08-19 VITALS — BP 133/82 | HR 99 | Ht 64.0 in | Wt 163.0 lb

## 2014-08-19 DIAGNOSIS — S134XXD Sprain of ligaments of cervical spine, subsequent encounter: Secondary | ICD-10-CM

## 2014-08-19 DIAGNOSIS — S060X0D Concussion without loss of consciousness, subsequent encounter: Secondary | ICD-10-CM

## 2014-08-19 MED ORDER — CYCLOBENZAPRINE HCL 10 MG PO TABS: ORAL_TABLET | ORAL | Status: AC

## 2014-08-19 MED ORDER — TRAMADOL HCL 50 MG PO TABS: ORAL_TABLET | ORAL | Status: AC

## 2014-08-19 NOTE — Assessment & Plan Note (Signed)
Continues to be resolved.
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
## 2014-08-23 ENCOUNTER — Encounter: Payer: Self-pay | Admitting: Sports Medicine

## 2014-08-25 ENCOUNTER — Ambulatory Visit (INDEPENDENT_AMBULATORY_CARE_PROVIDER_SITE_OTHER): Payer: Self-pay | Admitting: Physical Therapy

## 2014-08-25 DIAGNOSIS — S134XXD Sprain of ligaments of cervical spine, subsequent encounter: Secondary | ICD-10-CM

## 2014-08-25 DIAGNOSIS — M2550 Pain in unspecified joint: Secondary | ICD-10-CM

## 2014-08-25 DIAGNOSIS — M2560 Stiffness of unspecified joint, not elsewhere classified: Secondary | ICD-10-CM

## 2014-08-27 ENCOUNTER — Encounter (INDEPENDENT_AMBULATORY_CARE_PROVIDER_SITE_OTHER): Payer: 59 | Admitting: Physical Therapy

## 2014-08-27 DIAGNOSIS — S134XXD Sprain of ligaments of cervical spine, subsequent encounter: Secondary | ICD-10-CM

## 2014-08-27 DIAGNOSIS — M2560 Stiffness of unspecified joint, not elsewhere classified: Secondary | ICD-10-CM

## 2014-08-27 DIAGNOSIS — M2550 Pain in unspecified joint: Secondary | ICD-10-CM

## 2014-08-31 ENCOUNTER — Encounter (INDEPENDENT_AMBULATORY_CARE_PROVIDER_SITE_OTHER): Payer: 59 | Admitting: Physical Therapy

## 2014-08-31 DIAGNOSIS — M2550 Pain in unspecified joint: Secondary | ICD-10-CM

## 2014-08-31 DIAGNOSIS — S134XXD Sprain of ligaments of cervical spine, subsequent encounter: Secondary | ICD-10-CM

## 2014-08-31 DIAGNOSIS — M2560 Stiffness of unspecified joint, not elsewhere classified: Secondary | ICD-10-CM

## 2014-09-02 ENCOUNTER — Encounter (INDEPENDENT_AMBULATORY_CARE_PROVIDER_SITE_OTHER): Payer: 59 | Admitting: Physical Therapy

## 2014-09-02 DIAGNOSIS — M2560 Stiffness of unspecified joint, not elsewhere classified: Secondary | ICD-10-CM

## 2014-09-02 DIAGNOSIS — S134XXD Sprain of ligaments of cervical spine, subsequent encounter: Secondary | ICD-10-CM

## 2014-09-02 DIAGNOSIS — M2550 Pain in unspecified joint: Secondary | ICD-10-CM

## 2014-09-06 ENCOUNTER — Encounter (INDEPENDENT_AMBULATORY_CARE_PROVIDER_SITE_OTHER): Payer: 59 | Admitting: Physical Therapy

## 2014-09-06 DIAGNOSIS — S134XXD Sprain of ligaments of cervical spine, subsequent encounter: Secondary | ICD-10-CM

## 2014-09-06 DIAGNOSIS — M2550 Pain in unspecified joint: Secondary | ICD-10-CM

## 2014-09-06 DIAGNOSIS — M2560 Stiffness of unspecified joint, not elsewhere classified: Secondary | ICD-10-CM

## 2014-09-08 ENCOUNTER — Encounter (INDEPENDENT_AMBULATORY_CARE_PROVIDER_SITE_OTHER): Payer: 59 | Admitting: Physical Therapy

## 2014-09-08 DIAGNOSIS — M2560 Stiffness of unspecified joint, not elsewhere classified: Secondary | ICD-10-CM

## 2014-09-08 DIAGNOSIS — M2550 Pain in unspecified joint: Secondary | ICD-10-CM

## 2014-09-08 DIAGNOSIS — S134XXD Sprain of ligaments of cervical spine, subsequent encounter: Secondary | ICD-10-CM

## 2014-09-14 ENCOUNTER — Encounter (INDEPENDENT_AMBULATORY_CARE_PROVIDER_SITE_OTHER): Payer: 59 | Admitting: Physical Therapy

## 2014-09-14 DIAGNOSIS — M2560 Stiffness of unspecified joint, not elsewhere classified: Secondary | ICD-10-CM

## 2014-09-14 DIAGNOSIS — M2550 Pain in unspecified joint: Secondary | ICD-10-CM

## 2014-09-14 DIAGNOSIS — S134XXD Sprain of ligaments of cervical spine, subsequent encounter: Secondary | ICD-10-CM

## 2014-09-16 ENCOUNTER — Encounter (INDEPENDENT_AMBULATORY_CARE_PROVIDER_SITE_OTHER): Payer: 59 | Admitting: Physical Therapy

## 2014-09-16 DIAGNOSIS — M2550 Pain in unspecified joint: Secondary | ICD-10-CM

## 2014-09-16 DIAGNOSIS — M2560 Stiffness of unspecified joint, not elsewhere classified: Secondary | ICD-10-CM

## 2014-09-16 DIAGNOSIS — S134XXD Sprain of ligaments of cervical spine, subsequent encounter: Secondary | ICD-10-CM

## 2014-11-11 ENCOUNTER — Encounter: Payer: Self-pay | Admitting: Physician Assistant

## 2015-06-26 IMAGING — CR DG FOOT COMPLETE 3+V*R*
3 series · 3 of 3 positions shown · non-contrast
Comparison: None.

CLINICAL DATA: Heel pain. The patient is a long distance runner.

EXAM:
RIGHT FOOT COMPLETE - 3+ VIEW

[view not recorded (1 of 3)]
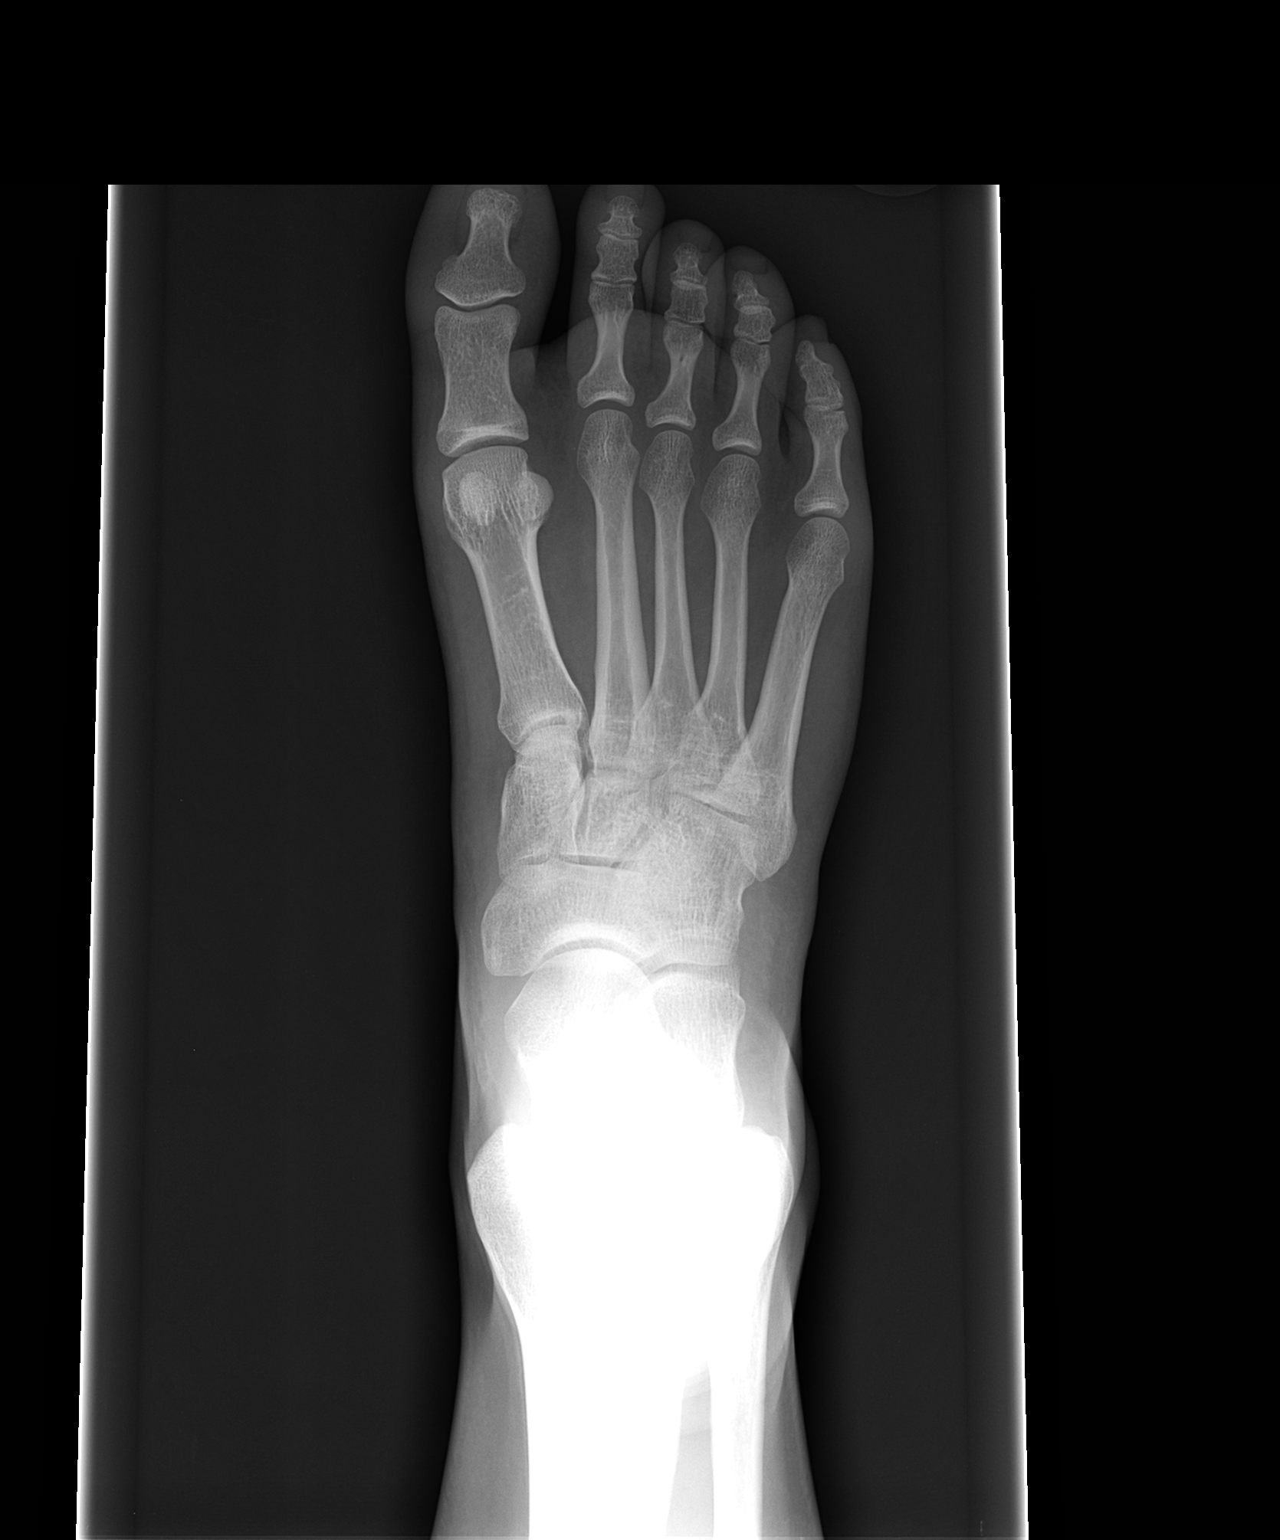

[view not recorded (2 of 3)]
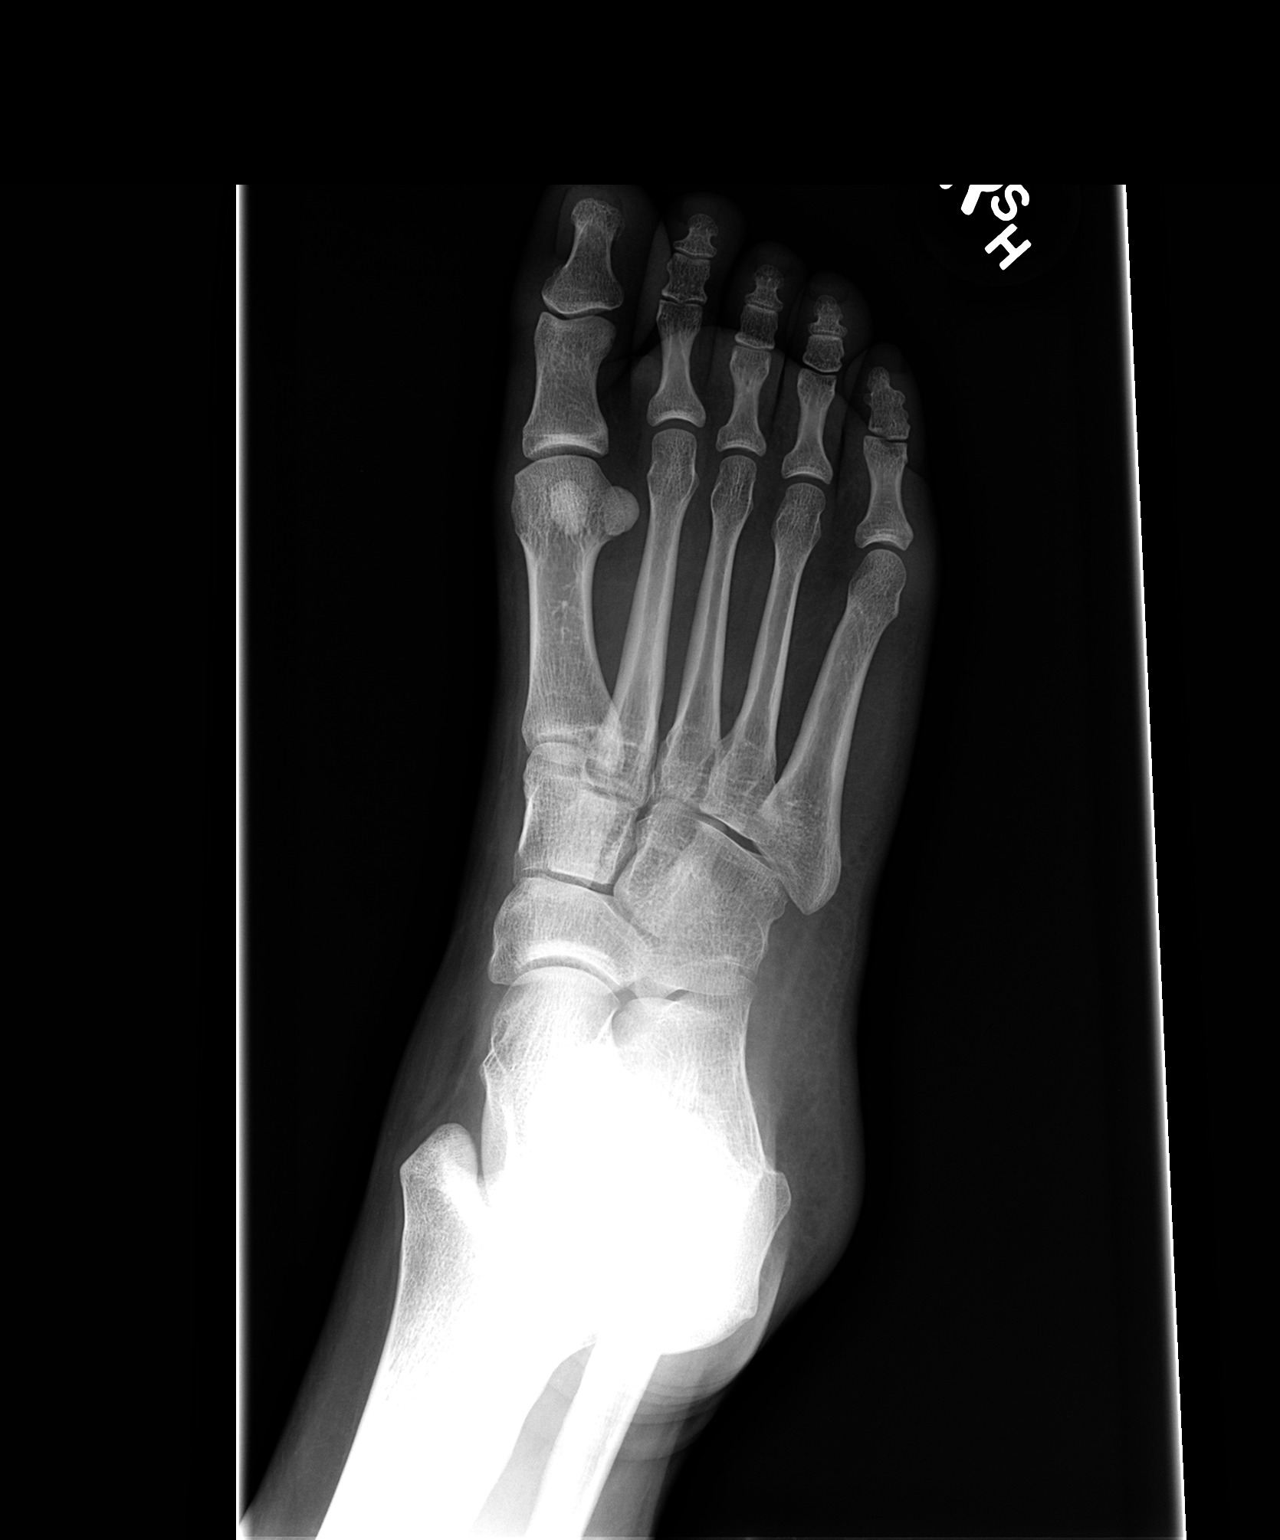

[view not recorded (3 of 3)]
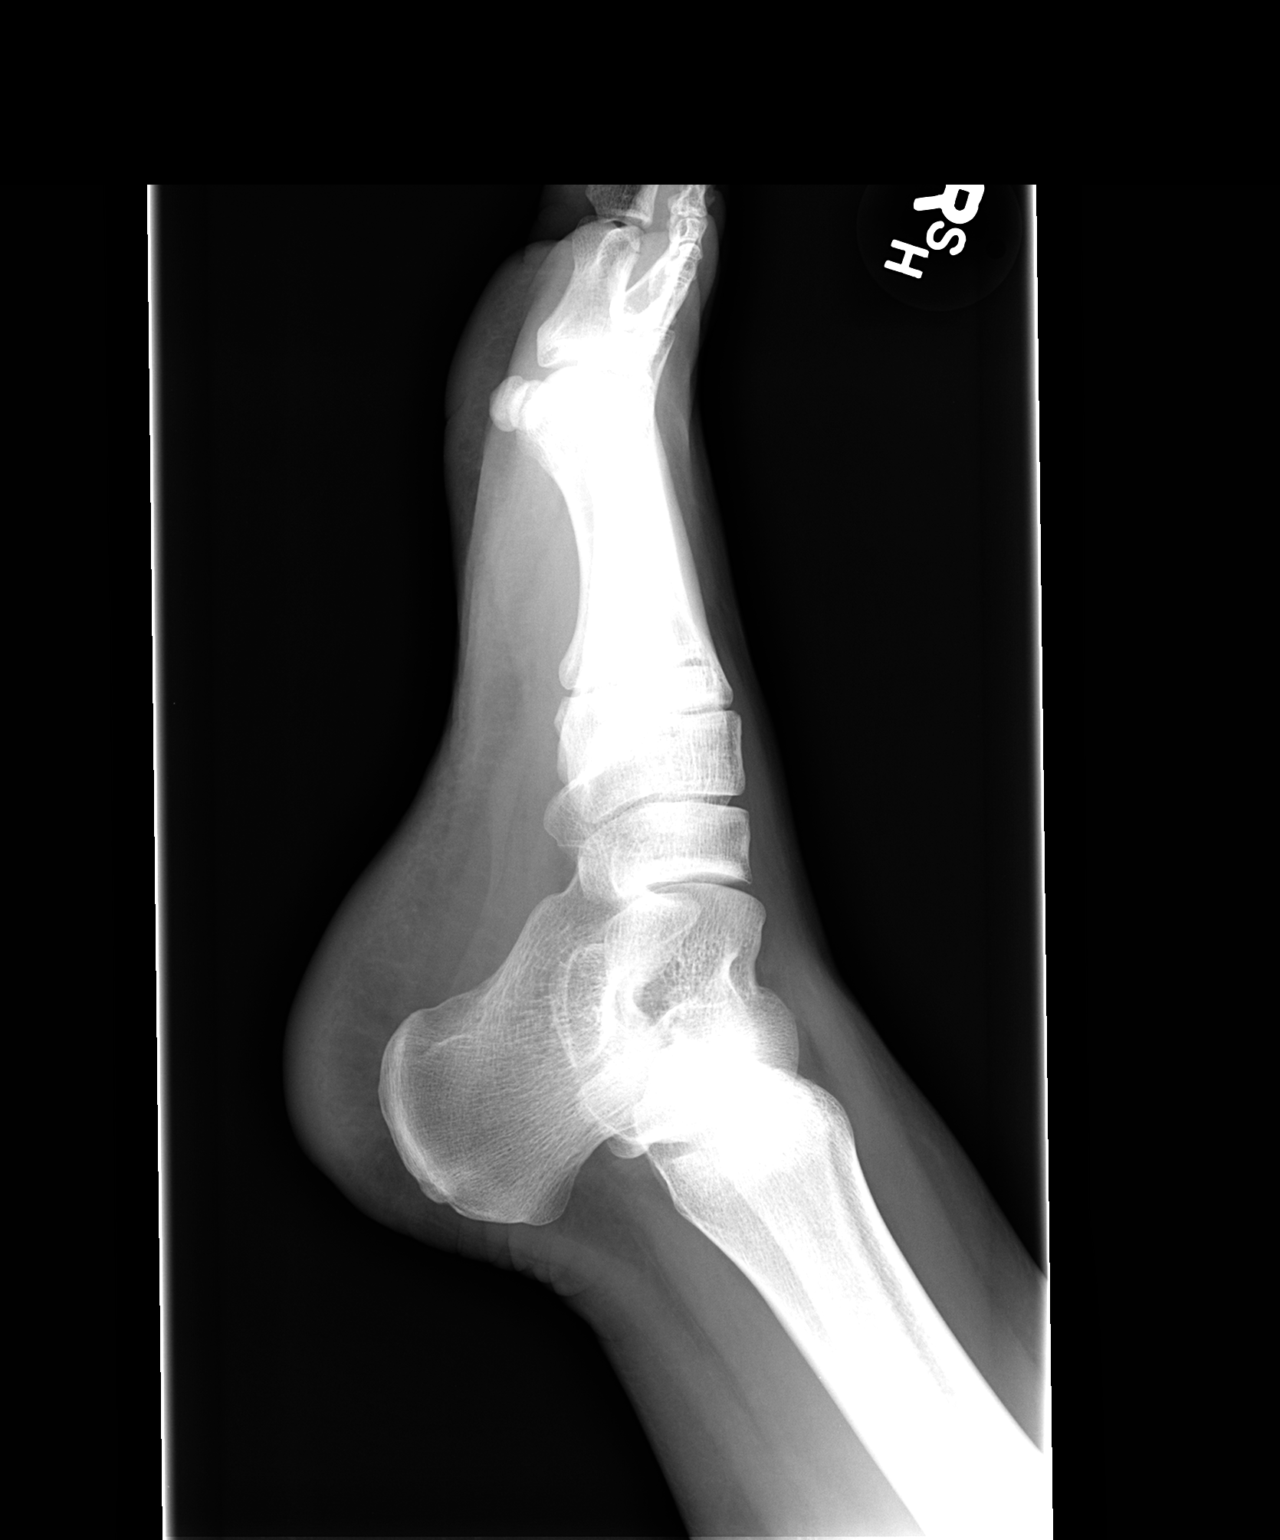

[3 of 3 positions shown; findings below may reference images not displayed]

FINDINGS: There is no evidence of fracture or dislocation. There is no
evidence of arthropathy or other focal bone abnormality. Soft
tissues are unremarkable. No visible stress fracture. No plantar
calcaneal spur.
IMPRESSION: Negative.
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
